# Patient Record
Sex: Male | Born: 1965 | Hispanic: Yes | Smoking: Never smoker
Health system: Southern US, Community
[De-identification: ages and names within clinical notes are randomized; demographics above are authoritative.]

## PROBLEM LIST (undated history)

## (undated) DIAGNOSIS — I739 Peripheral vascular disease, unspecified: Secondary | ICD-10-CM

---

## 2012-02-06 ENCOUNTER — Emergency Department: Payer: Self-pay | Admitting: Emergency Medicine

## 2016-01-27 ENCOUNTER — Emergency Department: Payer: Self-pay

## 2016-01-27 ENCOUNTER — Emergency Department
Admission: EM | Admit: 2016-01-27 | Discharge: 2016-01-27 | Disposition: A | Discharge status: Home / Self Care | Payer: Self-pay | Attending: Emergency Medicine | Admitting: Emergency Medicine

## 2016-01-27 ENCOUNTER — Encounter: Payer: Self-pay | Admitting: Emergency Medicine

## 2016-01-27 DIAGNOSIS — Y999 Unspecified external cause status: Secondary | ICD-10-CM | POA: Insufficient documentation

## 2016-01-27 DIAGNOSIS — L97521 Non-pressure chronic ulcer of other part of left foot limited to breakdown of skin: Secondary | ICD-10-CM | POA: Insufficient documentation

## 2016-01-27 DIAGNOSIS — Y939 Activity, unspecified: Secondary | ICD-10-CM | POA: Insufficient documentation

## 2016-01-27 DIAGNOSIS — Y929 Unspecified place or not applicable: Secondary | ICD-10-CM | POA: Insufficient documentation

## 2016-01-27 DIAGNOSIS — W268XXA Contact with other sharp object(s), not elsewhere classified, initial encounter: Secondary | ICD-10-CM | POA: Insufficient documentation

## 2016-01-27 DIAGNOSIS — Z23 Encounter for immunization: Secondary | ICD-10-CM | POA: Insufficient documentation

## 2016-01-27 MED ORDER — BACITRACIN ZINC 500 UNIT/GM EX OINT
TOPICAL_OINTMENT | Freq: Two times a day (BID) | CUTANEOUS | Status: DC
  Administered 2016-01-27: 1 via TOPICAL
  Filled 2016-01-27: qty 0.9

## 2016-01-27 MED ORDER — CEPHALEXIN 500 MG PO CAPS: 500.0000 mg | ORAL_CAPSULE | Freq: Four times a day (QID) | ORAL | 0 refills | Status: AC

## 2016-01-27 MED ORDER — TETANUS-DIPHTH-ACELL PERTUSSIS 5-2.5-18.5 LF-MCG/0.5 IM SUSP
0.5000 mL | Freq: Once | INTRAMUSCULAR | Status: AC
  Administered 2016-01-27: 0.5 mL via INTRAMUSCULAR
  Filled 2016-01-27: qty 0.5

## 2016-01-27 NOTE — ED Triage Notes (Signed)
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 Pt put a temporary tourniquet on leg but pulses present. Pt states that this happens around 1930. Pt is alert and oriented at his time in triage.

## 2016-01-27 NOTE — ED Notes (Signed)
Pt presented with laceration actively bleeding between 1st and 2nd digits of right foot; pressure dressing applied; pt says he cut his foot in the shower

## 2016-01-27 NOTE — ED Provider Notes (Signed)
[facility]-EMERGENCY DEPARTMENT Provider Note   CSN: [id number] Arrival date & time: 01/27/16  1953     History   Chief Complaint Chief Complaint  Patient presents with  . Extremity Laceration    HPI Thomas Robbins is a 50 y.o. male presents to the emergency department for evaluation of left great toe bleeding. Patient states he has had history of wound to the left great toe with a healed after going to the wound clinic. Wound healed to 3 years ago, patient been doing well up until 1 month ago skin began to breakdown and wound has recurred. Patient noticed bleeding to his left great toe while wearing his shoes at work today. He denies any trauma or injury. No fevers. He is not on any blood thinners. Tetanus is not up-to-date. He denies any pain. HPI  History reviewed. No pertinent past medical history.  There are no active problems to display for this patient.   History reviewed. No pertinent surgical history.     Home Medications    Prior to Admission medications   Medication Sig Start Date End Date Taking? Authorizing Provider  cephALEXin (KEFLEX) 500 MG capsule Take 1 capsule (500 mg total) by mouth 4 (four) times daily. 01/27/16 02/06/16  [name], PA-C    Family History No family history on file.  Social History Social History  Substance Use Topics  . Smoking status: Never Smoker  . Smokeless tobacco: Never Used  . Alcohol use No     Allergies   Patient has no known allergies.   Review of Systems Review of Systems  Constitutional: Negative for fatigue and fever.  Respiratory: Negative for shortness of breath.   Cardiovascular: Negative for chest pain and leg swelling.  Gastrointestinal: Negative for nausea and vomiting.  Musculoskeletal: Negative for gait problem and joint swelling.  Skin: Positive for wound. Negative for rash.     Physical Exam Updated Vital Signs BP (!) 145/82 (BP Location: Right Arm)   Pulse 74   Temp 97.9 F  (36.6 C) (Oral)   Resp 16   Ht 5\' 5"  (1.651 m)   Wt 73.9 kg   SpO2 96%   BMI 27.12 kg/m   Physical Exam  Constitutional: He appears well-developed and well-nourished.  HENT:  Head: Normocephalic and atraumatic.  Eyes: Conjunctivae and EOM are normal.  Neck: Normal range of motion. Neck supple.  Cardiovascular: Normal rate, regular rhythm, normal heart sounds and intact distal pulses.   No murmur heard. Pulmonary/Chest: Effort normal. No respiratory distress. He has no wheezes. He has no rales.  Abdominal: Soft. He exhibits no mass. There is no tenderness. There is no rebound and no guarding.  Musculoskeletal:  Examination of the left foot shows in between the first webspace of the great and second toe there is a 3 cm in diameter ulceration to the base of the great toe. Ulcer appears to be grade 2-3 with no active bleeding. There is no surrounding cellulitis and he is nontender to palpation. Sensation is intact. Cap refill is intact. He has 2+ dorsalis pedis pulse.  Neurological: He is alert.  Skin: Skin is warm and dry.  Psychiatric: He has a normal mood and affect.  Nursing note and vitals reviewed.    ED Treatments / Results  Labs (all labs ordered are listed, but only abnormal results are displayed) Labs Reviewed - No data to display  EKG  EKG Interpretation None       Radiology Dg Toe Great Left  Result Date: 01/27/2016 CLINICAL DATA:  Laceration between the first and second digits of the right foot. Initial encounter. EXAM: LEFT GREAT TOE COMPARISON:  None. FINDINGS: There is no evidence of fracture or dislocation. The known soft tissue laceration is not well characterized on radiograph. No radiopaque foreign bodies are seen. Visualized joint spaces are preserved. IMPRESSION: No evidence of fracture or dislocation. No radiopaque foreign bodies seen. Electronically Signed   By: Roanna RaiderJeffery  Chang M.D.   On: 01/27/2016 21:34    Procedures Procedures (including critical  care time)  Medications Ordered in ED Medications  Tdap (BOOSTRIX) injection 0.5 mL (0.5 mLs Intramuscular Given 01/27/16 2054)     Initial Impression / Assessment and Plan / ED Course  I have reviewed the triage vital signs and the nursing notes.  Pertinent labs & imaging results that were available during my care of the patient were reviewed by me and considered in my medical decision making (see chart for details).  Clinical Course    50 year old male with ulceration to the left great toe, states this has been going on for 1 month. Had a history of similar wound ulceration 3 years ago. Tetanus is updated today. Wound is cleaned and antibiotic ointment is applied. Patient will follow-up with wound clinic or podiatry. He is placed on prophylactic antibiotics. He is educated on signs and symptoms to return to the emergency department for.  Final Clinical Impressions(s) / ED Diagnoses   Final diagnoses:  Skin ulcer of left great toe, limited to breakdown of skin (HCC)    New Prescriptions New Prescriptions   CEPHALEXIN (KEFLEX) 500 MG CAPSULE    Take 1 capsule (500 mg total) by mouth 4 (four) times daily.     [name], PA-C 01/27/16 2150    Sharman CheekPhillip Stafford, MD 01/31/16 [phone]

## 2016-01-27 NOTE — Discharge Instructions (Signed)
Please apply antibiotic ointment to the wound daily. Keep area clean. Take antibiotics as prescribed and follow-up with wound clinic her foot doctor. Return to the ER for any worsening symptoms urgent changes in health.

## 2017-03-25 ENCOUNTER — Emergency Department: Payer: BLUE CROSS/BLUE SHIELD

## 2017-03-25 ENCOUNTER — Other Ambulatory Visit: Payer: Self-pay

## 2017-03-25 ENCOUNTER — Encounter: Payer: Self-pay | Admitting: Emergency Medicine

## 2017-03-25 ENCOUNTER — Inpatient Hospital Stay
Admission: EM | Admit: 2017-03-25 | Discharge: 2017-03-26 | DRG: 603 | Disposition: A | Discharge status: Home / Self Care | Payer: BLUE CROSS/BLUE SHIELD | Attending: Internal Medicine | Admitting: Internal Medicine

## 2017-03-25 DIAGNOSIS — L089 Local infection of the skin and subcutaneous tissue, unspecified: Secondary | ICD-10-CM | POA: Diagnosis present

## 2017-03-25 DIAGNOSIS — I10 Essential (primary) hypertension: Secondary | ICD-10-CM | POA: Diagnosis present

## 2017-03-25 DIAGNOSIS — Z8249 Family history of ischemic heart disease and other diseases of the circulatory system: Secondary | ICD-10-CM

## 2017-03-25 DIAGNOSIS — L03116 Cellulitis of left lower limb: Secondary | ICD-10-CM

## 2017-03-25 DIAGNOSIS — I739 Peripheral vascular disease, unspecified: Secondary | ICD-10-CM | POA: Diagnosis present

## 2017-03-25 DIAGNOSIS — S91302A Unspecified open wound, left foot, initial encounter: Secondary | ICD-10-CM

## 2017-03-25 HISTORY — DX: Peripheral vascular disease, unspecified: I73.9

## 2017-03-25 LAB — COMPREHENSIVE METABOLIC PANEL
ALBUMIN: 3.9 g/dL (ref 3.5–5.0)
ALK PHOS: 122 U/L (ref 38–126)
ALT: 29 U/L (ref 17–63)
ANION GAP: 5 (ref 5–15)
AST: 26 U/L (ref 15–41)
BUN: 19 mg/dL (ref 6–20)
CALCIUM: 8.7 mg/dL — AB (ref 8.9–10.3)
CO2: 23 mmol/L (ref 22–32)
Chloride: 107 mmol/L (ref 101–111)
Creatinine, Ser: 0.66 mg/dL (ref 0.61–1.24)
GFR calc Af Amer: >60 mL/min (ref >60)
GFR calc non Af Amer: >60 mL/min (ref >60)
Glucose, Bld: 102 mg/dL — ABNORMAL HIGH (ref 65–99)
Potassium: 4.1 mmol/L (ref 3.5–5.1)
SODIUM: 135 mmol/L (ref 135–145)
Total Bilirubin: 0.9 mg/dL (ref 0.3–1.2)
Total Protein: 7.6 g/dL (ref 6.5–8.1)

## 2017-03-25 LAB — HEMOGLOBIN A1C
Hgb A1c MFr Bld: 6 % — ABNORMAL HIGH (ref 4.8–5.6)
MEAN PLASMA GLUCOSE: 125.5 mg/dL

## 2017-03-25 LAB — CBC WITH DIFFERENTIAL/PLATELET
BASOS ABS: 0.1 10*3/uL (ref 0–0.1)
BASOS PCT: 1 %
EOS ABS: 0.7 10*3/uL (ref 0–0.7)
Eosinophils Relative: 10 %
HCT: 46.1 % (ref 40.0–52.0)
HEMOGLOBIN: 15.6 g/dL (ref 13.0–18.0)
Lymphocytes Relative: 15 %
Lymphs Abs: 1 10*3/uL (ref 1.0–3.6)
MCH: 30.7 pg (ref 26.0–34.0)
MCHC: 33.8 g/dL (ref 32.0–36.0)
MCV: 90.8 fL (ref 80.0–100.0)
Monocytes Absolute: 0.6 10*3/uL (ref 0.2–1.0)
Monocytes Relative: 8 %
NEUTROS PCT: 66 %
Neutro Abs: 4.7 10*3/uL (ref 1.4–6.5)
Platelets: 254 10*3/uL (ref 150–440)
RBC: 5.08 MIL/uL (ref 4.40–5.90)
RDW: 13.7 % (ref 11.5–14.5)
WBC: 7.1 10*3/uL (ref 3.8–10.6)

## 2017-03-25 LAB — LACTIC ACID, PLASMA: Lactic Acid, Venous: 0.9 mmol/L (ref 0.5–1.9)

## 2017-03-25 MED ORDER — SENNOSIDES-DOCUSATE SODIUM 8.6-50 MG PO TABS: 1.0000 | ORAL_TABLET | Freq: Every evening | ORAL | Status: DC | PRN

## 2017-03-25 MED ORDER — ACETAMINOPHEN 325 MG PO TABS: 650.0000 mg | ORAL_TABLET | Freq: Four times a day (QID) | ORAL | Status: DC | PRN

## 2017-03-25 MED ORDER — FLUCONAZOLE 50 MG PO TABS
ORAL_TABLET | ORAL | Status: AC
  Filled 2017-03-25: qty 3

## 2017-03-25 MED ORDER — SODIUM CHLORIDE 0.9 % IV SOLN: 250.0000 mL | INTRAVENOUS | Status: DC | PRN

## 2017-03-25 MED ORDER — ALBUTEROL SULFATE (2.5 MG/3ML) 0.083% IN NEBU: 2.5000 mg | INHALATION_SOLUTION | RESPIRATORY_TRACT | Status: DC | PRN

## 2017-03-25 MED ORDER — SODIUM CHLORIDE 0.9% FLUSH
3.0000 mL | Freq: Two times a day (BID) | INTRAVENOUS | Status: DC
  Administered 2017-03-25: 3 mL via INTRAVENOUS

## 2017-03-25 MED ORDER — ONDANSETRON HCL 4 MG PO TABS: 4.0000 mg | ORAL_TABLET | Freq: Four times a day (QID) | ORAL | Status: DC | PRN

## 2017-03-25 MED ORDER — PIPERACILLIN-TAZOBACTAM 4.5 G IVPB
4.5000 g | Freq: Once | INTRAVENOUS | Status: DC
  Filled 2017-03-25: qty 100

## 2017-03-25 MED ORDER — FLUCONAZOLE 50 MG PO TABS
150.0000 mg | ORAL_TABLET | Freq: Every day | ORAL | Status: DC
  Administered 2017-03-25: 150 mg via ORAL
  Filled 2017-03-25: qty 3

## 2017-03-25 MED ORDER — SODIUM CHLORIDE 0.9% FLUSH: 3.0000 mL | INTRAVENOUS | Status: DC | PRN

## 2017-03-25 MED ORDER — ENOXAPARIN SODIUM 40 MG/0.4ML ~~LOC~~ SOLN
40.0000 mg | SUBCUTANEOUS | Status: DC
  Filled 2017-03-25: qty 0.4

## 2017-03-25 MED ORDER — INFLUENZA VAC SPLIT QUAD 0.5 ML IM SUSY: 0.5000 mL | PREFILLED_SYRINGE | INTRAMUSCULAR | Status: DC

## 2017-03-25 MED ORDER — ONDANSETRON HCL 4 MG/2ML IJ SOLN: 4.0000 mg | Freq: Four times a day (QID) | INTRAMUSCULAR | Status: DC | PRN

## 2017-03-25 MED ORDER — ACETAMINOPHEN 650 MG RE SUPP: 650.0000 mg | Freq: Four times a day (QID) | RECTAL | Status: DC | PRN

## 2017-03-25 MED ORDER — HYDROCODONE-ACETAMINOPHEN 5-325 MG PO TABS
1.0000 | ORAL_TABLET | ORAL | Status: DC | PRN
  Administered 2017-03-26: 2 via ORAL
  Filled 2017-03-25: qty 2

## 2017-03-25 MED ORDER — BISACODYL 5 MG PO TBEC: 5.0000 mg | DELAYED_RELEASE_TABLET | Freq: Every day | ORAL | Status: DC | PRN

## 2017-03-25 MED ORDER — PIPERACILLIN-TAZOBACTAM 3.375 G IVPB
3.3750 g | Freq: Three times a day (TID) | INTRAVENOUS | Status: DC
  Administered 2017-03-25 – 2017-03-26 (×3): 3.375 g via INTRAVENOUS
  Filled 2017-03-25 (×6): qty 50

## 2017-03-25 MED ORDER — DAKINS (1/4 STRENGTH) 0.125 % EX SOLN
Freq: Two times a day (BID) | CUTANEOUS | Status: DC
  Administered 2017-03-25 – 2017-03-26 (×2)
  Filled 2017-03-25: qty 473

## 2017-03-25 MED ORDER — AMLODIPINE BESYLATE 10 MG PO TABS
10.0000 mg | ORAL_TABLET | Freq: Every day | ORAL | Status: DC
  Administered 2017-03-25: 10 mg via ORAL
  Filled 2017-03-25: qty 1

## 2017-03-25 MED ORDER — HYDRALAZINE HCL 20 MG/ML IJ SOLN: 10.0000 mg | Freq: Four times a day (QID) | INTRAMUSCULAR | Status: DC | PRN

## 2017-03-25 MED ORDER — PIPERACILLIN SOD-TAZOBACTAM SO 3.375 (3-0.375) G IV SOLR
4.5000 g | Freq: Once | INTRAVENOUS | Status: AC
  Administered 2017-03-25: 4.5 g via INTRAVENOUS
  Filled 2017-03-25: qty 4.5

## 2017-03-25 NOTE — H&P (Signed)
[facility] [place] [facility]   PATIENT NAME: Thomas Robbins    MR#:  [id number]  DATE OF BIRTH:  01/18/1966  DATE OF ADMISSION:  03/25/2017  PRIMARY CARE PHYSICIAN: Patient, No Pcp Per   REQUESTING/REFERRING PHYSICIAN: Loleta RoseForbach, Cory, MD  CHIEF COMPLAINT:   Chief Complaint  Patient presents with  . Wound Check   Worsening left foot wound HISTORY OF PRESENT ILLNESS:  Thomas Robbins  is a 52 y.o. male with a known history of poor circulation per patient.  The patient presents the ED with worsening left foot wound.  He denies any left foot pain but has swelling and erythema.  He denies any fever or chills.  ED physician Dr. York CeriseForbach discussed with podiatrist, who suggest admission and start antibiotics.  PAST MEDICAL HISTORY:   Past Medical History:  Diagnosis Date  . PVD (peripheral vascular disease) (HCC)     PAST SURGICAL HISTORY:  History reviewed. No pertinent surgical history.  No surgical history.  SOCIAL HISTORY:   Social History   Tobacco Use  . Smoking status: Never Smoker  . Smokeless tobacco: Never Used  Substance Use Topics  . Alcohol use: No    FAMILY HISTORY:   Family History  Problem Relation Age of Onset  . Peripheral vascular disease Mother     DRUG ALLERGIES:  No Known Allergies  REVIEW OF SYSTEMS:   Review of Systems  Constitutional: Negative for chills, fever and malaise/fatigue.  HENT: Negative for sore throat.   Eyes: Negative for blurred vision and double vision.  Respiratory: Negative for cough, hemoptysis, shortness of breath, wheezing and stridor.   Cardiovascular: Negative for chest pain, palpitations, orthopnea and leg swelling.  Gastrointestinal: Negative for abdominal pain, blood in stool, diarrhea, melena, nausea and vomiting.  Genitourinary: Negative for dysuria, flank pain and hematuria.  Musculoskeletal: Negative for back pain and joint pain.       Left foot swelling and red   Skin: Negative for rash.  Neurological: Negative for dizziness, sensory change, focal weakness, seizures, loss of consciousness, weakness and headaches.  Endo/Heme/Allergies: Negative for polydipsia.  Psychiatric/Behavioral: Negative for depression. The patient is not nervous/anxious.     MEDICATIONS AT HOME:   Prior to Admission medications   Not on File      VITAL SIGNS:  Blood pressure (!) 166/82, pulse 62, temperature 97.6 F (36.4 C), temperature source Oral, resp. rate 20, height 5\' 2"  (1.575 m), weight 159 lb (72.1 kg), SpO2 99 %.  PHYSICAL EXAMINATION:  Physical Exam  GENERAL:  52 y.o.-year-old patient lying in the bed with no acute distress.  EYES: Pupils equal, round, reactive to light and accommodation. No scleral icterus. Extraocular muscles intact.  HEENT: Head atraumatic, normocephalic. Oropharynx and nasopharynx clear.  NECK:  Supple, no jugular venous distention. No thyroid enlargement, no tenderness.  LUNGS: Normal breath sounds bilaterally, no wheezing, rales,rhonchi or crepitation. No use of accessory muscles of respiration.  CARDIOVASCULAR: S1, S2 normal. No murmurs, rubs, or gallops.  ABDOMEN: Soft, nontender, nondistended. Bowel sounds present. No organomegaly or mass.  EXTREMITIES: Bilateral leg and pedal edema, no cyanosis, or clubbing. Left foot swelling, erythema and discharge. NEUROLOGIC: Cranial nerves II through XII are intact. Muscle strength 5/5 in all extremities. Sensation intact. Gait not checked.  PSYCHIATRIC: The patient is alert and oriented x 3.  SKIN: No obvious rash, lesion, or ulcer.   LABORATORY PANEL:   CBC Recent Labs  Lab 03/25/17 1257  WBC 7.1  HGB  15.6  HCT 46.1  PLT 254   ------------------------------------------------------------------------------------------------------------------  Chemistries  Recent Labs  Lab 03/25/17 1257  NA 135  K 4.1  CL 107  CO2 23  GLUCOSE 102*  BUN 19  CREATININE 0.66  CALCIUM 8.7*    AST 26  ALT 29  ALKPHOS 122  BILITOT 0.9   ------------------------------------------------------------------------------------------------------------------  Cardiac Enzymes No results for input(s): TROPONINI in the last 168 hours. ------------------------------------------------------------------------------------------------------------------  RADIOLOGY:  Dg Foot Complete Left  Result Date: 03/25/2017 CLINICAL DATA:  Left foot ulcer for 6-7 months EXAM: LEFT FOOT - COMPLETE 3+ VIEW COMPARISON:  None. FINDINGS: Generalized osteopenia. No fracture or dislocation. Mild osteoarthritis of the first MTP joint. No periosteal reaction or bone destruction. No focal soft tissue abnormality. IMPRESSION: No acute osseous injury of the left foot. Electronically Signed   By: Elige Ko   On: 03/25/2017 13:28      IMPRESSION AND PLAN:   Left foot infection.  The patient is admitted to medical floor. Continue Zosyn and fluconazole, follow-up podiatrist and wound culture. Left of the x-ray: No acute osseous injury of the left foot.  Hypertension.  Start Norvasc and IV hydralazine as needed. Possible PVD.  Follow-up podiatrist recommendation.  May need vascular surgery consult.  All information was obtained via Spanish interpreter. All the records are reviewed and case discussed with ED provider. Management plans discussed with the patient, family and they are in agreement.  CODE STATUS: Full code.  TOTAL TIME TAKING CARE OF THIS PATIENT: 58 minutes.    Shaune Pollack M.D on 03/25/2017 at 5:27 PM  Between 7am to 6pm - Pager - [phone]  After 6pm go to [email or web address] - Social research officer, government  [facility] [place] [facility]  Office  [phone]  CC: Primary care physician; Patient, No Pcp Per   Note: This dictation was prepared with Dragon dictation along with smaller phrase technology. Any transcriptional errors that result from this process are unin

## 2017-03-25 NOTE — ED Notes (Signed)
Thomas Robbins from interpreter services called and said interpreter would not be available for 5-10 minutes. Pt informed of this and given option of using [facility]. Pt would like to wait for live interpreter.

## 2017-03-25 NOTE — ED Notes (Signed)
First Nurse Note:  Noreene Lar[facility]Jill at [facility] called to report that patient presented there this AM as a new patient.  Sent here because of left foot infection, left calf redness.  She states "rule out osteomyelitis".

## 2017-03-25 NOTE — Progress Notes (Signed)
Received verbal orders from podiatrist to apply dressing to left foot wound. Gauze, Dakins liquid to gauze and Kerlix.

## 2017-03-25 NOTE — Progress Notes (Signed)
Pharmacy Antibiotic Note  Thomas Robbins is a 52 y.o. male admitted on 03/25/2017 with cellulitis.  Pharmacy has been consulted for zosyn and Dakins solution dosing.  Plan: First dose will be zosyn 4.5gm iv x 1 dose at the request of Dr Thomas Robbins.  Zosyn 3.375g IV q8h (4 hour infusion).   Dr Thomas Robbins wanted Dakins solution for irrigation. My recommendation was Dakins 0.25% wound irrigated BID until podiatry can see patient, as instructed by uptodate for highly exudative wounds. Dr Thomas Robbins states would is highly exudative.   Height: 5\' 2"  (157.5 cm) Weight: 159 lb (72.1 kg) IBW/kg (Calculated) : 54.6  Temp (24hrs), Avg:97.5 F (36.4 C), Min:97.5 F (36.4 C), Max:97.5 F (36.4 C)  Recent Labs  Lab 03/25/17 1257 03/25/17 1304  WBC 7.1  --   CREATININE 0.66  --   LATICACIDVEN  --  0.9    Estimated Creatinine Clearance: 93 mL/min (by C-G formula based on SCr of 0.66 mg/dL).    No Known Allergies  Antimicrobials this admission: Anti-infectives (From admission, onward)   Start     Dose/Rate Route Frequency Ordered Stop   03/25/17 2200  piperacillin-tazobactam (ZOSYN) IVPB 3.375 g     3.375 g 12.5 mL/hr over 240 Minutes Intravenous Every 8 hours 03/25/17 1543     03/25/17 1600  piperacillin-tazobactam (ZOSYN) 4.5 g in dextrose 5 % 50 mL injection     4.5 g Intravenous  Once 03/25/17 1548     03/25/17 1545  fluconazole (DIFLUCAN) tablet 150 mg     150 mg Oral Daily 03/25/17 1538     03/25/17 1545  piperacillin-tazobactam (ZOSYN) IVPB 4.5 g  Status:  Discontinued     4.5 g 200 mL/hr over 30 Minutes Intravenous  Once 03/25/17 1543 03/25/17 1547   03/25/17 1536  fluconazole (DIFLUCAN) 50 MG tablet    Comments:  Thomas Robbins, Thomas Robbins   : cabinet override      03/25/17 1536 03/26/17 0344      Microbiology results: No results found for this or any previous visit (from the past 240 hour(s)).   Thank you for allowing pharmacy to be a part of this patient's care.  Thomas Robbins  Thomas Robbins 03/25/2017 3:49 PM

## 2017-03-25 NOTE — ED Notes (Signed)
First Nurse Note:  Patient sent from

## 2017-03-25 NOTE — ED Provider Notes (Signed)
[facility] Emergency Department Provider Note  ____________________________________________   First MD Initiated Contact with Patient 03/25/17 1459     (approximate)  I have reviewed the triage vital signs and the nursing notes.   HISTORY  Chief Complaint Wound Check  The patient and/or family speak(s) Spanish.  They understand they have the right to the use of a hospital interpreter, however at this time they prefer to speak directly with me in Spanish.  They know that they can ask for an interpreter at any time.   HPI Thomas Robbins is a 52 y.o. male with no known past medical history who presents for evaluation of a gradually worsening and now severe wound to the top of his left foot.  It causes some pain when he ambulates but he is mostly concerned because it continues to get worse and there is now redness that is moving up his lower leg.  He had a wound years ago that healed, and then about 7 months ago he developed another wound on the top of his left big toe which has spread and now involves at least 3 of his toes and appears deep.  There is some discharge and fluid coming from the wound.  Nothing makes it better or worse.  He has not seen a doctor about it.  He does not have diabetes of which she is aware.  Pain is worse with ambulation but is still relatively fever/chills, chest pain, shortness of breath, nausea, vomiting, and abdominal pain.  Past Medical History:  Diagnosis Date  . PVD (peripheral vascular disease) [facility](HCC)     Patient Active Problem List   Diagnosis Date Noted  . Left foot infection 03/25/2017    History reviewed. No pertinent surgical history.  Prior to Admission medications   Not on File    Allergies Patient has no known allergies.  Family History  Problem Relation Age of Onset  . Peripheral vascular disease Mother     Social History Social History   Tobacco Use  . Smoking status: Never Smoker  .  Smokeless tobacco: Never Used  Substance Use Topics  . Alcohol use: No  . Drug use: No    Review of Systems Constitutional: No fever/chills Eyes: No visual changes. ENT: No sore throat. Cardiovascular: Denies chest pain. Respiratory: Denies shortness of breath. Gastrointestinal: No abdominal pain.  No nausea, no vomiting.  No diarrhea.  No constipation. Genitourinary: Negative for dysuria. Musculoskeletal: Swelling and foot with wound . negative for neck pain.  Negative for back pain. Integumentary: Worsening wound on top of left foot and redness radiating into left lower leg Neurological: Negative for headaches, focal weakness or numbness.   ____________________________________________   PHYSICAL EXAM:  VITAL SIGNS: ED Triage Vitals  Enc Vitals Group     BP 03/25/17 1236 (!) 160/77     Pulse Rate 03/25/17 1236 66     Resp 03/25/17 1236 16     Temp 03/25/17 1236 (!) 97.5 F (36.4 C)     Temp Source 03/25/17 1236 Oral     SpO2 03/25/17 1236 98 %     Weight 03/25/17 1237 72.1 kg (159 lb)     Height 03/25/17 1237 1.575 m (5\' 2" )     Head Circumference --      Peak Flow --      Pain Score 03/25/17 1300 4     Pain Loc --      Pain Edu? --  Excl. in GC? --     Constitutional: Alert and oriented. Well appearing and in no acute distress. Eyes: Conjunctivae are normal.  Head: Atraumatic. Neck: No stridor.  No meningeal signs.   Cardiovascular: Normal rate, regular rhythm. Good peripheral circulation. Grossly normal heart sounds. Respiratory: Normal respiratory effort.  No retractions. Lungs CTAB. Gastrointestinal: Soft and nontender. No distention.  Musculoskeletal: Edema and erythema of the left foot and extending into the left lower leg as described and pictured below.  No other acute abnormalities visualized on musculoskeletal exam Neurologic:  Normal speech and language. No gross focal neurologic deficits are appreciated.  Psychiatric: Mood and affect are normal.  Speech and behavior are normal. Skin: Chronic looking wound on top of left foot but with erythema that extends up nearly to the knee of his left leg.  See photo below:    ____________________________________________   LABS (all labs ordered are listed, but only abnormal results are displayed)  Labs Reviewed  COMPREHENSIVE METABOLIC PANEL - Abnormal; Notable for the following components:      Result Value   Glucose, Bld 102 (*)    Calcium 8.7 (*)    All other components within normal limits  AEROBIC/ANAEROBIC CULTURE (SURGICAL/DEEP WOUND)  CBC WITH DIFFERENTIAL/PLATELET  LACTIC ACID, PLASMA  HEMOGLOBIN A1C  HIV ANTIBODY (ROUTINE TESTING)  HEMOGLOBIN A1C  BASIC METABOLIC PANEL  CBC   ____________________________________________  EKG  None - EKG not ordered by ED physician ____________________________________________  RADIOLOGY Marylou Mccoy, personally viewed and evaluated these images (plain radiographs) as part of my medical decision making, as well as reviewing the written report by the radiologist.  ED MD interpretation: No obvious bony abnormalities or evidence of osteomyelitis  Official radiology report(s): Dg Foot Complete Left  Result Date: 03/25/2017 CLINICAL DATA:  Left foot ulcer for 6-7 months EXAM: LEFT FOOT - COMPLETE 3+ VIEW COMPARISON:  None. FINDINGS: Generalized osteopenia. No fracture or dislocation. Mild osteoarthritis of the first MTP joint. No periosteal reaction or bone destruction. No focal soft tissue abnormality. IMPRESSION: No acute osseous injury of the left foot. Electronically Signed   By: Elige Ko   On: 03/25/2017 13:28    ____________________________________________   PROCEDURES  Critical Care performed: No   Procedure(s) performed:   Procedures   ____________________________________________   INITIAL IMPRESSION / ASSESSMENT AND PLAN / ED COURSE  As part of my medical decision making, I reviewed the following data within  the electronic MEDICAL RECORD NUMBER     Differential diagnosis includes, but is not limited to, bacterial and/or fungal infection of a chronic wound that is developed into cellulitis, osteomyelitis, necrotizing fasciitis, gangrene.  Patient may or may not have diabetes as well.  No evidence of sepsis.  Reassuring lab work without even a leukocytosis, but the patient does appear to have cellulitis spreading up his leg and he has a very clearly infected foot wound.  I will call podiatry and discuss.  Clinical Course as of Mar 26 2039  Wed Mar 25, 2017  1522 Spoke with Dr. Orland Jarred who will review the photograph and call me back with recommendations.  [CF]  1549  spoke by phone with Dr. Orland Jarred who recommended broad-spectrum antibiotics that include pseudomonal treatment (Zosyn at the Pseudomonas dosing of 4.5 g) as well as fungal treatment, most likely fluconazole daily for about a week.  He also recommended a specific type of solution to be used on the feet which I ordered after discussing with the pharmacist.  He recommended the  patient be admitted for at least 24 hours of treatment before being set up as an outpatient and he will consult while in the hospital.  I discussed the case with the hospitalist who will admit after confirming all the medications with the pharmacist.    Clinical Course User Index [CF] Loleta Rose, MD    ____________________________________________  FINAL CLINICAL IMPRESSION(S) / ED DIAGNOSES  Final diagnoses:  Cellulitis of left lower leg  Wound of left foot     MEDICATIONS GIVEN DURING THIS VISIT:  Medications  fluconazole (DIFLUCAN) tablet 150 mg (150 mg Oral Given 03/25/17 1543)  piperacillin-tazobactam (ZOSYN) IVPB 3.375 g (not administered)  fluconazole (DIFLUCAN) 50 MG tablet (  Not Given 03/25/17 1544)  sodium hypochlorite (DAKIN'S 1/4 STRENGTH) topical solution (not administered)  acetaminophen (TYLENOL) tablet 650 mg (not administered)    Or    acetaminophen (TYLENOL) suppository 650 mg (not administered)  ondansetron (ZOFRAN) tablet 4 mg (not administered)    Or  ondansetron (ZOFRAN) injection 4 mg (not administered)  albuterol (PROVENTIL) (2.5 MG/3ML) 0.083% nebulizer solution 2.5 mg (not administered)  enoxaparin (LOVENOX) injection 40 mg (not administered)  sodium chloride flush (NS) 0.9 % injection 3 mL (not administered)  sodium chloride flush (NS) 0.9 % injection 3 mL (not administered)  0.9 %  sodium chloride infusion (not administered)  HYDROcodone-acetaminophen (NORCO/VICODIN) 5-325 MG per tablet 1-2 tablet (not administered)  senna-docusate (Senokot-S) tablet 1 tablet (not administered)  bisacodyl (DULCOLAX) EC tablet 5 mg (not administered)  Influenza vac split quadrivalent PF (FLUARIX) injection 0.5 mL (not administered)  amLODipine (NORVASC) tablet 10 mg (10 mg Oral Given 03/25/17 1852)  hydrALAZINE (APRESOLINE) injection 10 mg (not administered)  piperacillin-tazobactam (ZOSYN) 4.5 g in dextrose 5 % 50 mL injection (4.5 g Intravenous New Bag/Given 03/25/17 1714)     ED Discharge Orders    None       Note:  This document was prepared using Dragon voice recognition software and may include unintentional dictation errors.    Loleta Rose, MD 03/25/17 2048

## 2017-03-25 NOTE — ED Triage Notes (Signed)
Pt to ED via POV, pt was sent by MD for evaluation of the left foot. Pt has wound on the left foot that states has been there for about 7 months. Wound bed is pinkish red with black discoloration on the 2nd toe. Pt has moderate swelling in the foot and redness that extends up the lower left leg. Pt denies hx/o DM. Pt in NAD at this time.

## 2017-03-26 LAB — BASIC METABOLIC PANEL
Anion gap: 9 (ref 5–15)
BUN: 19 mg/dL (ref 6–20)
CALCIUM: 8.5 mg/dL — AB (ref 8.9–10.3)
CO2: 23 mmol/L (ref 22–32)
CREATININE: 0.76 mg/dL (ref 0.61–1.24)
Chloride: 106 mmol/L (ref 101–111)
GFR calc Af Amer: >60 mL/min (ref >60)
GLUCOSE: 104 mg/dL — AB (ref 65–99)
Potassium: 3.9 mmol/L (ref 3.5–5.1)
SODIUM: 138 mmol/L (ref 135–145)

## 2017-03-26 LAB — CBC
HCT: 43.9 % (ref 40.0–52.0)
Hemoglobin: 15 g/dL (ref 13.0–18.0)
MCH: 31.2 pg (ref 26.0–34.0)
MCHC: 34.2 g/dL (ref 32.0–36.0)
MCV: 91.3 fL (ref 80.0–100.0)
PLATELETS: 226 10*3/uL (ref 150–440)
RBC: 4.81 MIL/uL (ref 4.40–5.90)
RDW: 13.5 % (ref 11.5–14.5)
WBC: 7.4 10*3/uL (ref 3.8–10.6)

## 2017-03-26 LAB — HEMOGLOBIN A1C
HEMOGLOBIN A1C: 5.8 % — AB (ref 4.8–5.6)
MEAN PLASMA GLUCOSE: 119.76 mg/dL

## 2017-03-26 LAB — HIV ANTIBODY (ROUTINE TESTING W REFLEX): HIV SCREEN 4TH GENERATION: NONREACTIVE

## 2017-03-26 MED ORDER — DAKINS 0.4-0.5 % EX SOLN: Freq: Two times a day (BID) | CUTANEOUS | 0 refills | Status: AC

## 2017-03-26 MED ORDER — AMLODIPINE BESYLATE 2.5 MG PO TABS: 2.5000 mg | ORAL_TABLET | Freq: Every day | ORAL | 0 refills | Status: AC

## 2017-03-26 MED ORDER — SULFAMETHOXAZOLE-TRIMETHOPRIM 800-160 MG PO TABS: 1.0000 | ORAL_TABLET | Freq: Two times a day (BID) | ORAL | 0 refills | Status: AC

## 2017-03-26 NOTE — Progress Notes (Signed)
[facility]         [place], [place]   03/26/2017  Patient: Thomas Robbins   Date of Birth:  05/21/1965  Date of admission:  03/25/2017  Date of Discharge  03/26/2017    To Whom it May Concern:   Thomas Robbins  may return to school on 04/06/2017.  If you have any questions or concerns, please don't hesitate to call.  Sincerely,   Orie FishermanSrikar R Annison Birchard M.D Office : [phone]   .

## 2017-03-26 NOTE — Progress Notes (Addendum)
DISCHARGE NOTE:  Spanish interpreter at the bedside. Pt given discharge instructions, work note and prescriptions. Pt verbalized understanding. Pt wheeled to car by staff.

## 2017-03-26 NOTE — Progress Notes (Signed)
  ORTHOPAEDIC CONSULTATION  REQUESTING PHYSICIAN: Thomas Robbins, [name], MD  Chief Complaint: Left foot infection  HPI: Thomas Robbins is Robbins 52 y.o. male who complains of wound on his left foot.  He states it has actually been there for many months now.  Started as Robbins blister and progressively became worse.  He has wound on the top of his left great toe second toe and third toe.  He has not seen Robbins physician.  He was just concerned about it and came to the ER to have this evaluated.  Past Medical History:  Diagnosis Date  . PVD (peripheral vascular disease) (HCC)    History reviewed. No pertinent surgical history. Social History   Socioeconomic History  . Marital status: Unknown    Spouse name: None  . Number of children: None  . Years of education: None  . Highest education level: None  Social Needs  . Financial resource strain: None  . Food insecurity - worry: None  . Food insecurity - inability: None  . Transportation needs - medical: None  . Transportation needs - non-medical: None  Occupational History  . None  Tobacco Use  . Smoking status: Never Smoker  . Smokeless tobacco: Never Used  Substance and Sexual Activity  . Alcohol use: No  . Drug use: No  . Sexual activity: None  Other Topics Concern  . None  Social History Narrative  . None   Family History  Problem Relation Age of Onset  . Peripheral vascular disease Mother    No Known Allergies Prior to Admission medications   Not on File   Dg Foot Complete Left  Result Date: 03/25/2017 CLINICAL DATA:  Left foot ulcer for 6-7 months EXAM: LEFT FOOT - COMPLETE 3+ VIEW COMPARISON:  None. FINDINGS: Generalized osteopenia. No fracture or dislocation. Mild osteoarthritis of the first MTP joint. No periosteal reaction or bone destruction. No focal soft tissue abnormality. IMPRESSION: No acute osseous injury of the left foot. Electronically Signed   By: Thomas KoHetal  Robbins   On: 03/25/2017 13:28    Positive ROS: All  other systems have been reviewed and were otherwise negative with the exception of those mentioned in the HPI and as above.  12 point ROS was performed.  Physical Exam: General: Alert and oriented.  No apparent distress.  Vascular:  Left foot:Dorsalis Pedis:  present Posterior Tibial:  present  Right foot: Dorsalis Pedis:  present Posterior Tibial:  present  Neuro:absent protective sensation  Derm: Right foot without wound or sore.  Ortho/MS: Superficial ulceration on the dorsal aspect of the great toe second toe and third toe with mild amount of fibrotic tissue.  Minimal surrounding erythema.  No purulence.  No real cellulitis or lymphangitic streaking.  Assessment: Superficial left dorsal foot ulcerations unknown etiology  Plan: He has surprisingly excellent pulses to his left and right feet.  He does have superficial ulcerations with mild fibrotic tissue.  Minimal cellulitis.  No purulence at all.  Dakin solutions has been ordered and I recommended continuing this in the outpatient clinic and this can be changed daily.  He should follow-up with us within the next 2 weeks for further evaluation and treatment.  Keep this up and elevated and minimize ambulation at this time.    Thomas Robbins, Thomas Robbins, [name] Cell [phone]   03/26/2017 1:32 PM

## 2017-03-30 LAB — AEROBIC/ANAEROBIC CULTURE W GRAM STAIN (SURGICAL/DEEP WOUND)

## 2017-03-30 LAB — AEROBIC/ANAEROBIC CULTURE (SURGICAL/DEEP WOUND)

## 2017-03-31 NOTE — Discharge Summary (Signed)
[facility] [place] [facility]   PATIENT NAME: Thomas Robbins    MR#:  [id number]  DATE OF BIRTH:  08-21-1965  DATE OF ADMISSION:  03/25/2017 ADMITTING PHYSICIAN: Shaune Pollack, MD  DATE OF DISCHARGE: 03/26/2017  5:30 PM  PRIMARY CARE PHYSICIAN: Patient, No Pcp Per   ADMISSION DIAGNOSIS:  Cellulitis of left lower leg [L03.116] Wound of left foot [S91.302A]  DISCHARGE DIAGNOSIS:  Active Problems:   Left foot infection   SECONDARY DIAGNOSIS:   Past Medical History:  Diagnosis Date  . PVD (peripheral vascular disease) (HCC)      ADMITTING HISTORY  HISTORY OF PRESENT ILLNESS:  Thomas Robbins  is a 52 y.o. male with a known history of poor circulation per patient.  The patient presents the ED with worsening left foot wound.  He denies any left foot pain but has swelling and erythema.  He denies any fever or chills.  ED physician Dr. York Cerise discussed with podiatrist, who suggest admission and start antibiotics.  HOSPITAL COURSE:   *Left foot superficial wound.  Seen by podiatry.  Did not need debridement.  Started on Lin[place] solution and 1 week of oral antibiotics at discharge.  Prescriptions given. Follow-up with podiatry in 2 weeks.  * HTN Started Norvasc and prescription given  Patient stable for discharge home.  CONSULTS OBTAINED:  Treatment Team:  Recardo Evangelist, [name]  DRUG ALLERGIES:  No Known Allergies  DISCHARGE MEDICATIONS:   Allergies as of 03/26/2017   No Known Allergies     Medication List    TAKE these medications   amLODipine 2.5 MG tablet Commonly known as:  NORVASC Take 1 tablet (2.5 mg total) by mouth daily.   sodium hypochlorite external solution Apply topically 2 (two) times daily. Toe wound   sulfamethoxazole-trimethoprim 800-160 MG tablet Commonly known as:  BACTRIM DS,SEPTRA DS Take 1 tablet by mouth 2 (two) times daily.       Today   VITAL SIGNS:  Blood pressure 131/79, pulse 61,  temperature 98.2 F (36.8 C), temperature source Oral, resp. rate 18, height 5\' 2"  (1.575 m), weight 72.1 kg (159 lb), SpO2 98 %.  I/O:  No intake or output data in the 24 hours ending 03/31/17 1712  PHYSICAL EXAMINATION:  Physical Exam  GENERAL:  52 y.o.-year-old patient lying in the bed with no acute distress.  LUNGS: Normal breath sounds bilaterally, no wheezing, rales,rhonchi or crepitation. No use of accessory muscles of respiration.  CARDIOVASCULAR: S1, S2 normal. No murmurs, rubs, or gallops.  ABDOMEN: Soft, non-tender, non-distended. Bowel sounds present. No organomegaly or mass.  NEUROLOGIC: Moves all 4 extremities. PSYCHIATRIC: The patient is alert and oriented x 3.  SKIN: Left foot superficial wound  DATA REVIEW:   CBC Recent Labs  Lab 03/26/17 0348  WBC 7.4  HGB 15.0  HCT 43.9  PLT 226    Chemistries  Recent Labs  Lab 03/25/17 1257 03/26/17 0348  NA 135 138  K 4.1 3.9  CL 107 106  CO2 23 23  GLUCOSE 102* 104*  BUN 19 19  CREATININE 0.66 0.76  CALCIUM 8.7* 8.5*  AST 26  --   ALT 29  --   ALKPHOS 122  --   BILITOT 0.9  --     Cardiac Enzymes No results for input(s): TROPONINI in the last 168 hours.  Microbiology Results  Results for orders placed or performed during the hospital encounter of 03/25/17  Aerobic/Anaerobic Culture (surgical/deep wound)     Status:  None   Collection Time: 03/25/17  3:38 PM  Result Value Ref Range Status   Specimen Description   Final    FOOT Performed at [facility], [address] [place], [place] [address]    Special Requests   Final    LEFT FOOT Performed at [facility], [address] [place], [place] [address]    Gram Stain   Final    RARE WBC PRESENT,BOTH PMN AND MONONUCLEAR ABUNDANT GRAM POSITIVE COCCI ABUNDANT GRAM NEGATIVE RODS Performed at [facility], [address] [place], [place] [address]    Culture   Final    MODERATE STAPHYLOCOCCUS AUREUS WITHIN MIXED  CULTURE MIXED ANAEROBIC FLORA PRESENT.  CALL LAB IF FURTHER IID REQUIRED.    Report Status 03/30/2017 FINAL  Final   Organism ID, Bacteria STAPHYLOCOCCUS AUREUS  Final      Susceptibility   Staphylococcus aureus - MIC*    CIPROFLOXACIN <=0.5 SENSITIVE Sensitive     ERYTHROMYCIN <=0.25 SENSITIVE Sensitive     GENTAMICIN <=0.5 SENSITIVE Sensitive     OXACILLIN <=0.25 SENSITIVE Sensitive     TETRACYCLINE <=1 SENSITIVE Sensitive     VANCOMYCIN 1 SENSITIVE Sensitive     TRIMETH/SULFA <=10 SENSITIVE Sensitive     CLINDAMYCIN <=0.25 SENSITIVE Sensitive     RIFAMPIN <=0.5 SENSITIVE Sensitive     Inducible Clindamycin NEGATIVE Sensitive     * MODERATE STAPHYLOCOCCUS AUREUS    RADIOLOGY:  No results found.  Follow up with PCP in 1 week.  Management plans discussed with the patient, family and they are in agreement.  CODE STATUS:  Code Status History    Date Active Date Inactive Code Status Order ID Comments User Context   03/25/2017 16:37 03/26/2017 22:06 Full Code [id number]  Shaune Pollackhen, Qing, MD Inpatient      TOTAL TIME TAKING CARE OF THIS PATIENT ON DAY OF DISCHARGE: more than 30 minutes.   Molinda BailiffSrikar R Eilis Chestnutt M.D on 03/31/2017 at 5:12 PM  Between 7am to 6pm - Pager - [phone]  After 6pm go to [email or web address] - password [email or web address]  [facility]  Office  [phone]  CC: Primary care physician; Patient, No Pcp Per  Note: This dictation was prepared with Dragon dictation along with smaller phrase technology. Any transcriptional errors that result from this process are unintentional.

## 2019-09-20 IMAGING — CR DG FOOT COMPLETE 3+V*L*
1 series · 3 of 3 positions shown · non-contrast
Comparison: None.

CLINICAL DATA: Left foot ulcer for 6-7 months

EXAM:
LEFT FOOT - COMPLETE 3+ VIEW

[Series 1: dg foot complete left · 0.14mm/px · 3 of 3 slices shown]
[im 1/3]
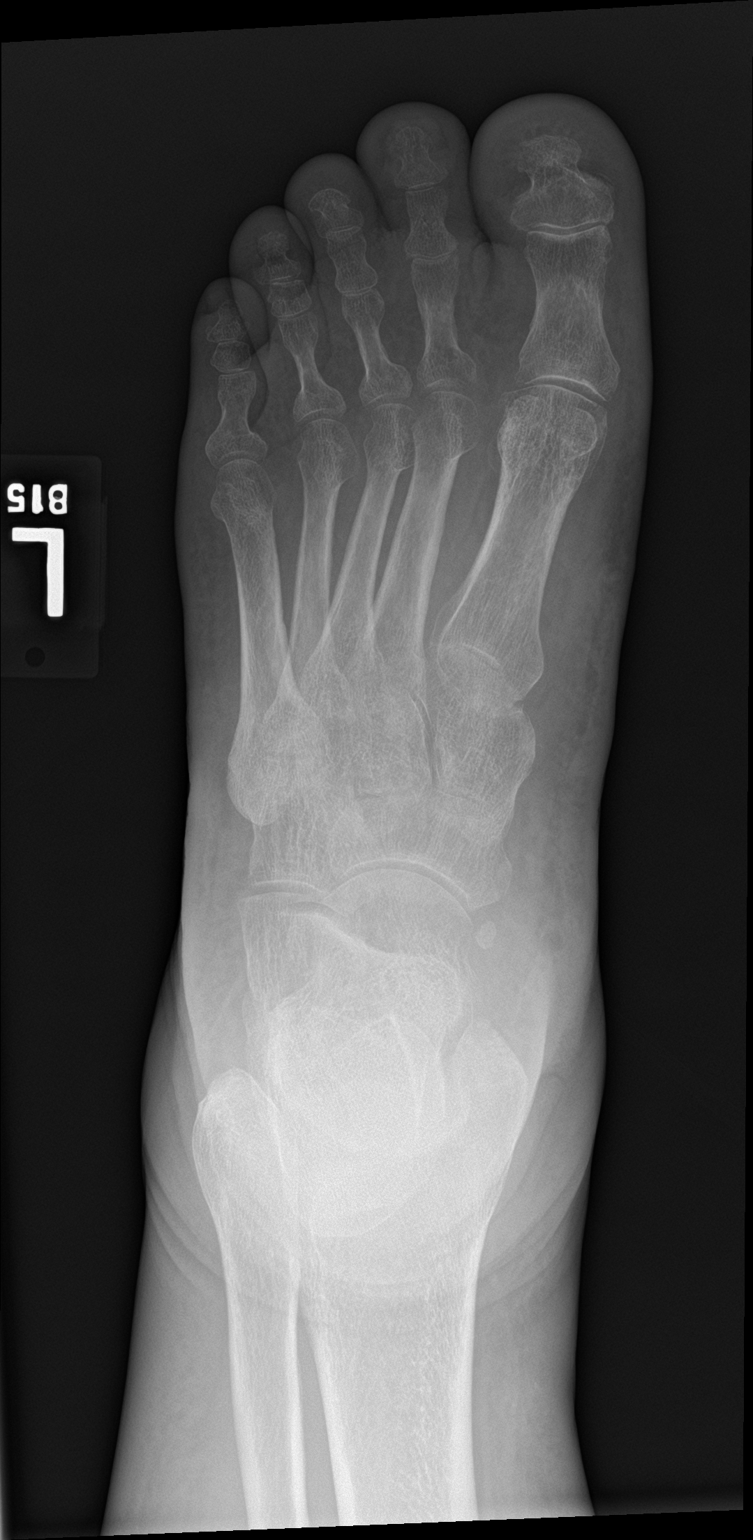
[im 2/3]
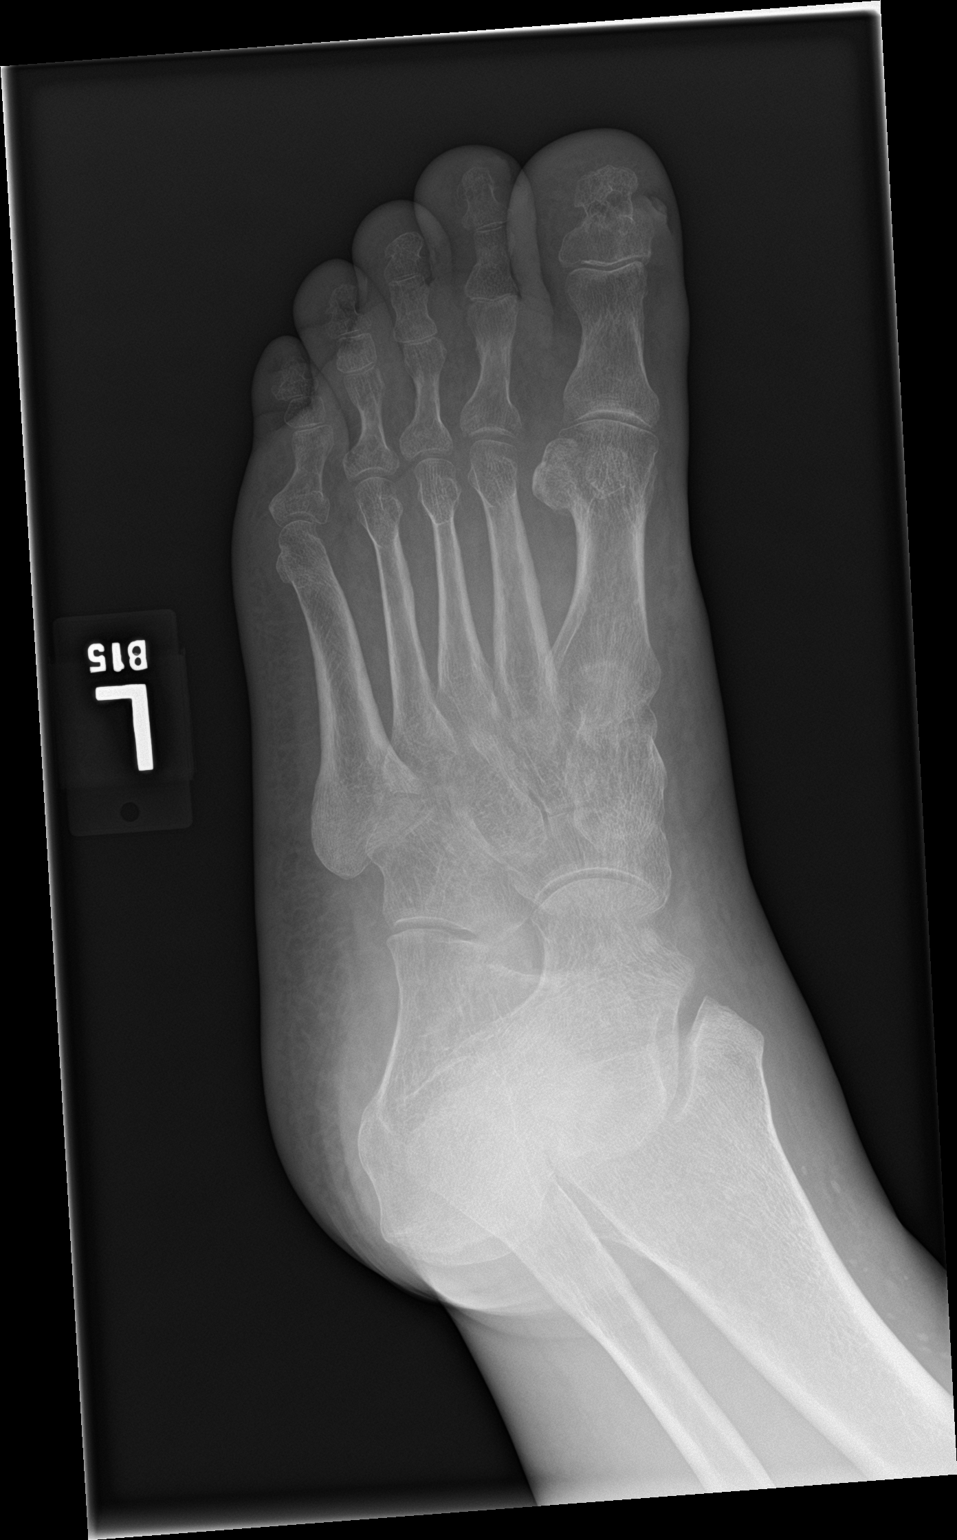
[im 3/3]
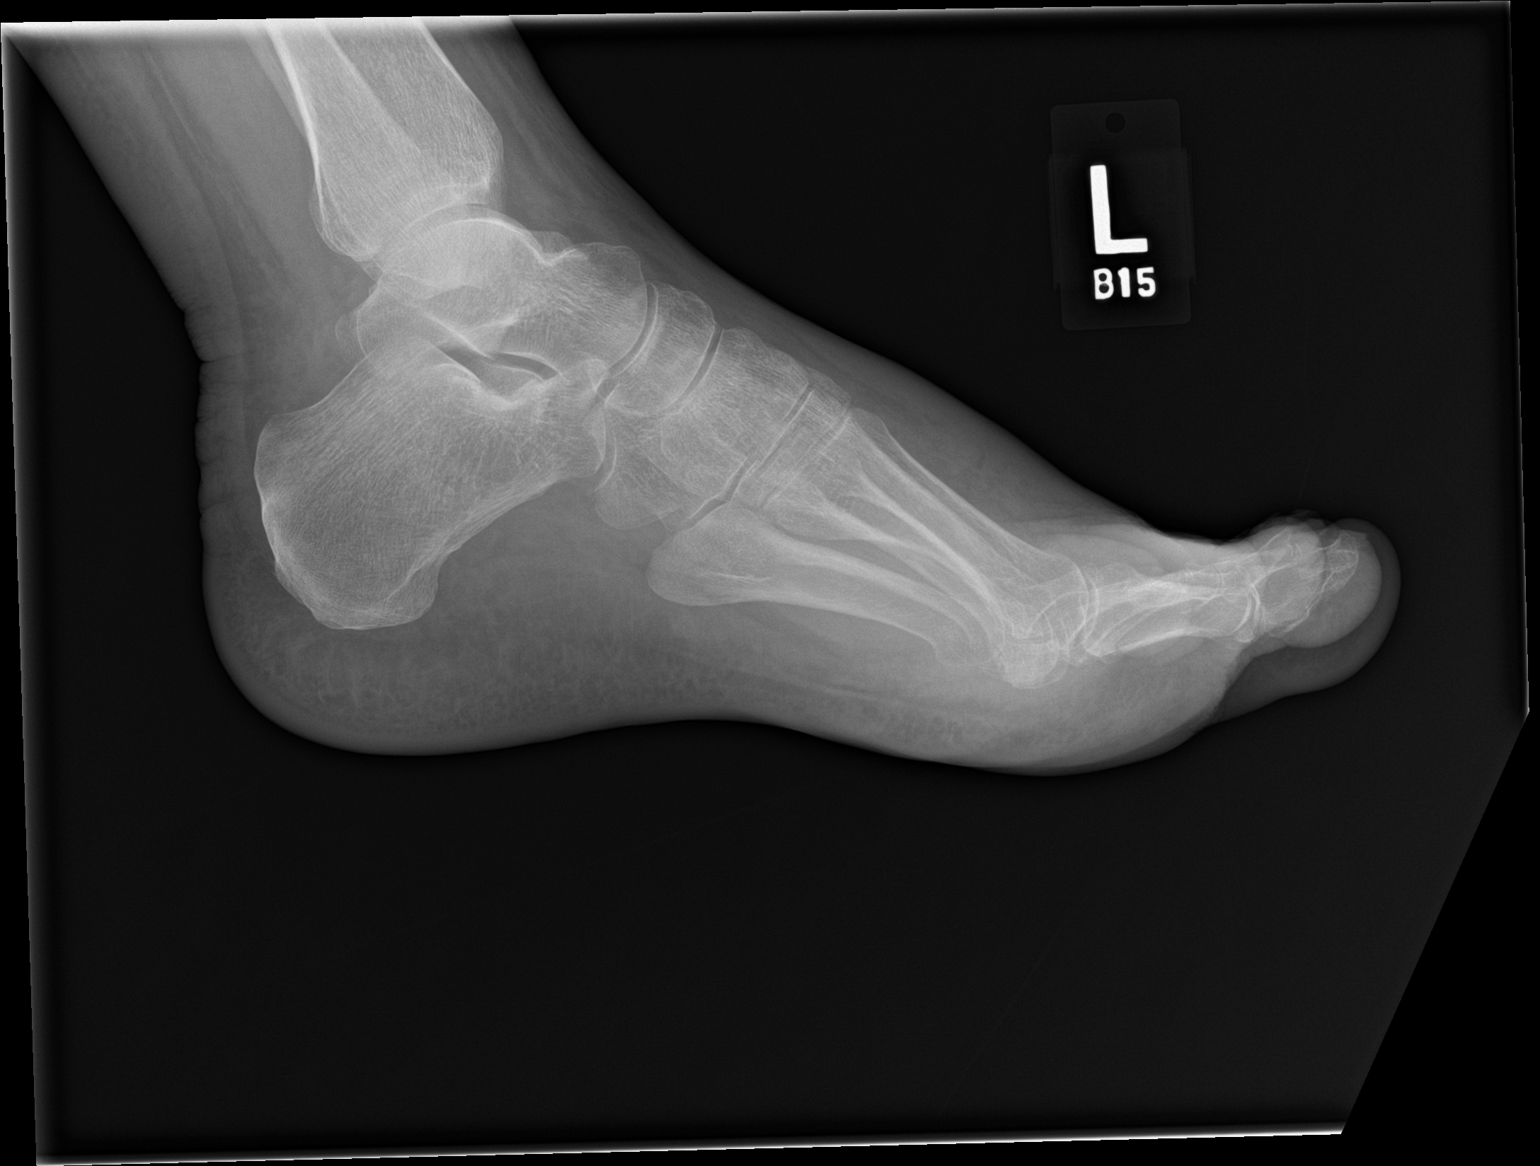

[3 of 3 positions shown; findings below may reference images not displayed]

FINDINGS: Generalized osteopenia. No fracture or dislocation. Mild
osteoarthritis of the first MTP joint. No periosteal reaction or
bone destruction. No focal soft tissue abnormality.
IMPRESSION: No acute osseous injury of the left foot.
